# Patient Record
Sex: Male | Born: 1995 | Race: Black or African American | Hispanic: No | Marital: Married | Smoking: Never smoker
Health system: Southern US, Community
[De-identification: ages and names within clinical notes are randomized; demographics above are authoritative.]

---
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
## 2014-07-19 IMAGING — CR DG FOREARM 2V*L*
1 series · 1 of 1 positions shown · non-contrast
Comparison: None.

CLINICAL DATA: EXTREMITY LACERATION EXTREMITY LACERATION

EXAM:
LEFT FOREARM - 2 VIEW

[view not recorded]
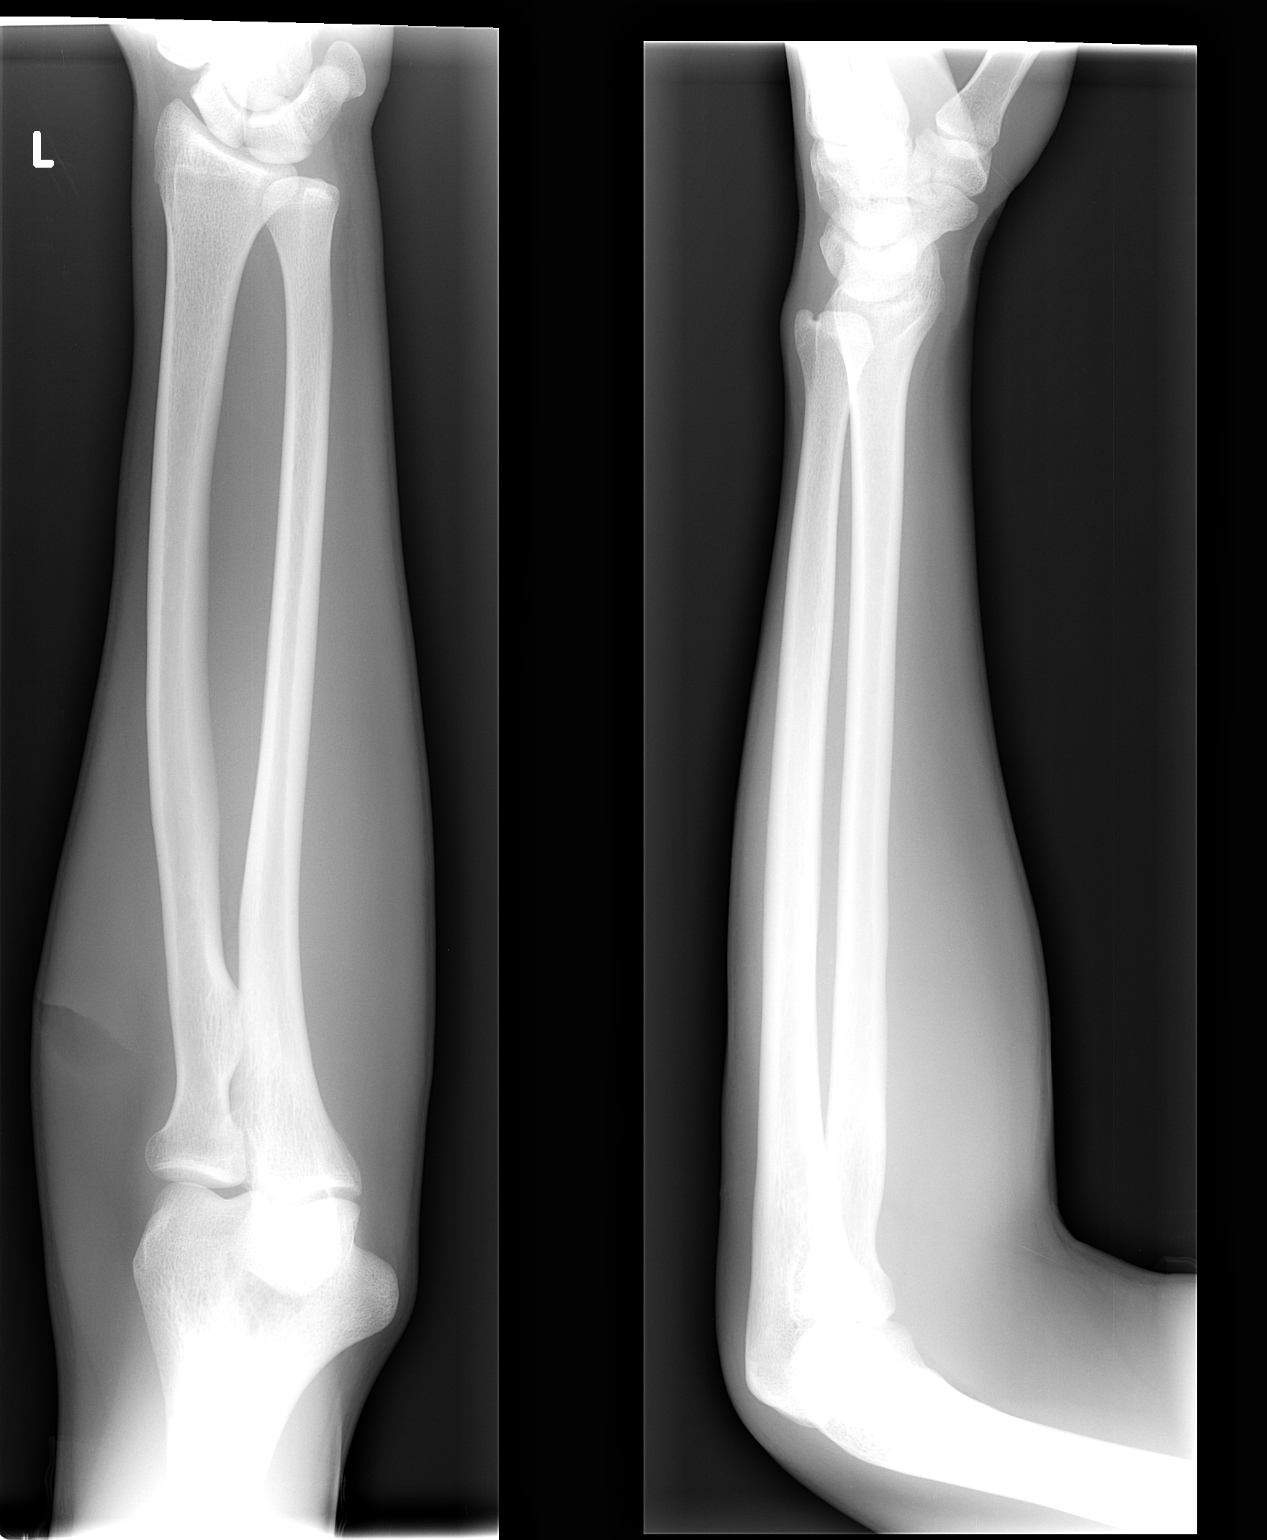

[1 of 1 positions shown; findings below may reference images not displayed]

FINDINGS: Large soft tissue defect at the proximal forearm is compatible with
history of laceration. No retained foreign body. No acute fracture
or dislocation. Limited views of the wrist and elbow are
unremarkable.
IMPRESSION: Soft tissue laceration at the proximal left forearm with no retained
foreign body identified.

No acute fracture or dislocation.

## 2018-11-04 ENCOUNTER — Other Ambulatory Visit: Payer: Self-pay | Admitting: *Deleted

## 2018-11-04 DIAGNOSIS — Z20822 Contact with and (suspected) exposure to covid-19: Secondary | ICD-10-CM

## 2018-11-05 LAB — NOVEL CORONAVIRUS, NAA: SARS-CoV-2, NAA: NOT DETECTED

## 2019-08-15 ENCOUNTER — Ambulatory Visit: Payer: Self-pay

## 2021-05-24 ENCOUNTER — Other Ambulatory Visit: Payer: Self-pay

## 2021-05-24 ENCOUNTER — Encounter (HOSPITAL_COMMUNITY): Payer: Self-pay | Admitting: Emergency Medicine

## 2021-05-24 ENCOUNTER — Emergency Department (HOSPITAL_COMMUNITY)
Admission: EM | Admit: 2021-05-24 | Discharge: 2021-05-24 | Disposition: A | Discharge status: Home / Self Care | Payer: Self-pay | Attending: Emergency Medicine | Admitting: Emergency Medicine

## 2021-05-24 DIAGNOSIS — H1032 Unspecified acute conjunctivitis, left eye: Secondary | ICD-10-CM | POA: Insufficient documentation

## 2021-05-24 MED ORDER — ERYTHROMYCIN 5 MG/GM OP OINT: TOPICAL_OINTMENT | OPHTHALMIC | 0 refills | Status: DC

## 2021-05-24 NOTE — ED Provider Notes (Signed)
?[name] ?Provider Note ? ? ?CSN: [id number] ?Arrival date & time: 05/24/21  1052 ? ?  ? ?History ? ?Chief Complaint  ?Patient presents with  ? Conjunctivitis  ? ? ?Travis Hurst is a 26 y.o. male. ? ?Patient with redness and discharge from the left eye started yesterday.  Was exposed to some family members with pinkeye.  He thinks it is that.  No eyeball pain does not feel like anything is in the eye.  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  And is used to having foreign body in the eye.  None of it feels like that.  This is just itchy.  Does have allergies.  But no problem with the right eye.  Patient without any other symptoms.  No upper respiratory symptoms. ? ? ?  ? ?Home Medications ?Prior to Admission medications   ?Medication Sig Start Date End Date Taking? Authorizing Provider  ?erythromycin ophthalmic ointment Place a 1/2 inch ribbon of ointment into the lower eyelid of the left eye 3 times a day. 05/24/21  Yes Vanetta Mulders, MD  ?cyclobenzaprine (FLEXERIL) 10 MG tablet Take 10 mg by mouth 3 (three) times daily as needed for muscle spasms.    [provider]  ?   ? ?Allergies    ?Patient has no known allergies.   ? ?Review of Systems   ?Review of Systems  ?Constitutional:  Negative for chills and fever.  ?HENT:  Negative for ear pain and sore throat.   ?Eyes:  Positive for discharge, redness and itching. Negative for photophobia, pain and visual disturbance.  ?Respiratory:  Negative for cough and shortness of breath.   ?Cardiovascular:  Negative for chest pain and palpitations.  ?Gastrointestinal:  Negative for abdominal pain and vomiting.  ?Genitourinary:  Negative for dysuria and hematuria.  ?Musculoskeletal:  Negative for arthralgias and back pain.  ?Skin:  Negative for color change and rash.  ?Neurological:  Negative for seizures and syncope.  ?All other systems reviewed and are negative. ? ?Physical  Exam ?Updated Vital Signs ?BP (!) 147/93 (BP Location: Right Arm)   Pulse 72   Temp 98.4 ?F (36.9 ?C) (Oral)   Resp 18   Ht 1.88 m (6\' 2" )   Wt 90.7 kg   SpO2 100%   BMI 25.68 kg/m?  ?Physical Exam ?Vitals and nursing note reviewed.  ?Constitutional:   ?   General: He is not in acute distress. ?   Appearance: Normal appearance. He is well-developed. He is not ill-appearing.  ?HENT:  ?   Head: Normocephalic and atraumatic.  ?Eyes:  ?   General:     ?   Left eye: Discharge present. ?   Extraocular Movements: Extraocular movements intact.  ?   Pupils: Pupils are equal, round, and reactive to light.  ?   Comments: Left eye with purulent discharge.  No hyphema.  No evidence of foreign body.  Cornea clear.  No corneal uptake.  ?Cardiovascular:  ?   Rate and Rhythm: Normal rate and regular rhythm.  ?   Heart sounds: No murmur heard. ?Pulmonary:  ?   Effort: Pulmonary effort is normal. No respiratory distress.  ?   Breath sounds: Normal breath sounds.  ?Abdominal:  ?   Palpations: Abdomen is soft.  ?   Tenderness: There is no abdominal tenderness.  ?Musculoskeletal:     ?   General: No swelling.  ?  Cervical back: Neck supple.  ?Skin: ?   General: Skin is warm and dry.  ?   Capillary Refill: Capillary refill takes less than 2 seconds.  ?Neurological:  ?   General: No focal deficit present.  ?   Mental Status: He is alert and oriented to person, place, and time.  ?Psychiatric:     ?   Mood and Affect: Mood normal.  ? ? ?ED Results / Procedures / Treatments   ?Labs ?(all labs ordered are listed, but only abnormal results are displayed) ?Labs Reviewed - No data to display ? ?EKG ?None ? ?Radiology ?No results found. ? ?Procedures ?Procedures  ? ? ?Medications Ordered in ED ?Medications - No data to display ? ?ED Course/ Medical Decision Making/ A&P ?  ?                        ?Medical Decision Making ?Risk ?Prescription drug management. ? ? ?Patient probably with a viral conjunctivitis.  Does not seem to have foreign  body does not seem to be a UV keratitis from his job as a Psychologist, occupational.  Plus patient does not think it reminds him of that at all.  He is experience both of those problems due to his job. ? ?Will treat with erythromycin ophthalmic ointment.  Work note provided as needed ?Final Clinical Impression(s) / ED Diagnoses ?Final diagnoses:  ?Acute conjunctivitis of left eye, unspecified acute conjunctivitis type  ? ? ?Rx / DC Orders ?ED Discharge Orders   ? ?      Ordered  ?  erythromycin ophthalmic ointment       ? 05/24/21 1158  ? ?  ?  ? ?  ? ? ?  ?Vanetta Mulders, MD ?05/24/21 1203 ? ?

## 2021-05-24 NOTE — ED Triage Notes (Signed)
Pt having redness and discharge from left eye, family members diagnosis with pink eye. ?

## 2021-05-24 NOTE — Discharge Instructions (Signed)
Return for any new or worse symptoms.  Apply the antibiotic ointment into the lower lid of the left eye 3 times a day.  Use warm compresses to clean out any crusting or any of the purulent discharge. ?

## 2022-03-29 ENCOUNTER — Emergency Department (HOSPITAL_COMMUNITY)
Admission: EM | Admit: 2022-03-29 | Discharge: 2022-03-29 | Disposition: A | Discharge status: Home / Self Care | Payer: Self-pay | Attending: Emergency Medicine | Admitting: Emergency Medicine

## 2022-03-29 ENCOUNTER — Other Ambulatory Visit: Payer: Self-pay

## 2022-03-29 DIAGNOSIS — X58XXXA Exposure to other specified factors, initial encounter: Secondary | ICD-10-CM | POA: Insufficient documentation

## 2022-03-29 DIAGNOSIS — T401X1A Poisoning by heroin, accidental (unintentional), initial encounter: Secondary | ICD-10-CM | POA: Insufficient documentation

## 2022-03-29 LAB — RAPID URINE DRUG SCREEN, HOSP PERFORMED
Amphetamines: NOT DETECTED
Barbiturates: NOT DETECTED
Benzodiazepines: NOT DETECTED
Cocaine: NOT DETECTED
Opiates: POSITIVE — AB
Tetrahydrocannabinol: POSITIVE — AB

## 2022-03-29 NOTE — Discharge Instructions (Signed)
You were seen in the emergency department following an accidental overdose. Thankfully you remained stable after Narcan was administered to you by EMS. Given that you remained stable during the observation period, I believe you are safe to discharge home but please refrain from using any recreational substance as there is always risk of injury and death from these.

## 2022-03-29 NOTE — ED Provider Notes (Signed)
[facility] Provider Note   CSN: [id number] Arrival date & time: 03/29/22  1055     History Chief Complaint  Patient presents with   Drug Overdose    Travis Hurst is a 27 y.o. male.  Patient presents to the emergency department with complaints of drug overdose.  He reports that he was at work earlier today when he used heroin around 9:45 AM.  Soon after he became unconscious and had EMS called in.  Patient had 2 mg of Narcan given which helped return patient somewhat to baseline.  Patient was observed to be stable and ambulating in triage without any acute distress.  Patient is now reporting that he is fatigued and tired.  Denies prior use of heroin or other drugs.  Not currently using any substances including alcohol, tobacco, marijuana, IV drug use.  Currently denying any chest pain, shortness of breath, abdominal pain, nausea, vomiting, diarrhea.   Drug Overdose      Home Medications Prior to Admission medications   Medication Sig Start Date End Date Taking? Authorizing Provider  cyclobenzaprine (FLEXERIL) 10 MG tablet Take 10 mg by mouth 3 (three) times daily as needed for muscle spasms. Patient not taking: Reported on 03/29/2022    [provider]  erythromycin ophthalmic ointment Place a 1/2 inch ribbon of ointment into the lower eyelid of the left eye 3 times a day. Patient not taking: Reported on 03/29/2022 05/24/21   Fredia Sorrow, MD      Allergies    Patient has no known allergies.    Review of Systems   Review of Systems  Neurological:  Positive for syncope.  All other systems reviewed and are negative.   Physical Exam Updated Vital Signs BP (!) 159/84   Pulse 91   Temp 98.7 F (37.1 C) (Oral)   Resp 15   Ht 6\' 2"  (1.88 m)   Wt 83.9 kg   SpO2 100%   BMI 23.75 kg/m  Physical Exam Vitals and nursing note reviewed.  Constitutional:      General: He is not in acute distress.    Appearance: Normal  appearance. He is not ill-appearing.  HENT:     Head: Normocephalic and atraumatic.     Mouth/Throat:     Mouth: Mucous membranes are dry.  Eyes:     General: No scleral icterus.       Right eye: No discharge.        Left eye: No discharge.     Extraocular Movements: Extraocular movements intact.     Conjunctiva/sclera: Conjunctivae normal.     Pupils: Pupils are equal, round, and reactive to light.  Cardiovascular:     Rate and Rhythm: Normal rate and regular rhythm.     Pulses: Normal pulses.     Heart sounds: Normal heart sounds. No murmur heard. Pulmonary:     Effort: Pulmonary effort is normal. No respiratory distress.     Breath sounds: Normal breath sounds.  Abdominal:     General: Abdomen is flat. There is no distension.     Tenderness: There is no abdominal tenderness.  Musculoskeletal:     Cervical back: Normal range of motion and neck supple. No rigidity.  Skin:    General: Skin is warm and dry.     Capillary Refill: Capillary refill takes less than 2 seconds.     Findings: No erythema.  Neurological:     General: No focal deficit present.  Mental Status: He is alert and oriented to person, place, and time. Mental status is at baseline.     Cranial Nerves: No cranial nerve deficit.     Motor: No weakness.  Psychiatric:        Mood and Affect: Mood normal.    ED Results / Procedures / Treatments   Labs (all labs ordered are listed, but only abnormal results are displayed) Labs Reviewed  RAPID URINE DRUG SCREEN, HOSP PERFORMED - Abnormal; Notable for the following components:      Result Value   Opiates POSITIVE (*)    Tetrahydrocannabinol POSITIVE (*)    All other components within normal limits    EKG EKG Interpretation  Date/Time:  Thursday March 29 2022 11:02:20 EST Ventricular Rate:  80 PR Interval:  160 QRS Duration: 81 QT Interval:  388 QTC Calculation: 448 R Axis:   62 Text Interpretation: Sinus rhythm Probable left atrial enlargement RSR'  in V1 or V2, probably normal variant ST elevation suggests acute pericarditis No old tracing to compare Confirmed by Malvin Johns [phone]) on 03/30/2022 9:19:30 AM  Radiology No results found.  Procedures Procedures   Medications Ordered in ED Medications - No data to display  ED Course/ Medical Decision Making/ A&P                           Medical Decision Making Amount and/or Complexity of Data Reviewed Labs: ordered.   This patient presents to the ED for concern of drug overdose. Differential diagnosis includes heroin use, accidental overdose, intentional overdose   Lab Tests:  I Ordered, and personally interpreted labs.  The pertinent results include:  UDS positive for opiates and tetrahydrocannabinol   Problem List / ED Course:  Patient presented to the ED following a drug overdose. After talking with patient, it appears that this was an accidental overdose as patient used heroin provided by a friend. Patient denied previously using heroin and reported only marijuana and alcohol use previously. Patient denied any thoughts of self-harm or suicide. Patient expressed concerns that the drug may have been "laced with fentanyl". Patient was stable during the entire encounter with no acute concerning changes noted in vitals or cognitive state but appeared somewhat remorseful about this event. Given patient arrived to the conscious, alert, stable, and in no acute distress, a two hour period of observation was utilized during which time patient remained stable and I did not feel it was necessary to perform any additional lab workup or interventions during this time. Provided patient with substance use resources in the community. Patient agreeable with plan to discharge home and verbalized understanding return precautions. Also advised patient against any substance use. All questions answered prior to patient discharge.   Final Clinical Impression(s) / ED Diagnoses Final diagnoses:   Accidental overdose of heroin, initial encounter [facility])    Rx / DC Orders ED Discharge Orders     None         Vladimir Creeks 04/02/22 0825    Fransico Meadow, MD 04/02/22 1725

## 2022-03-29 NOTE — ED Triage Notes (Signed)
Pt BIB [facility] from work for possible overdose. 2 mg nasal narcan given by EMS, pt admits to doing heroin around 0945. Pt is alert and oriented in triage.

## 2022-09-04 ENCOUNTER — Ambulatory Visit
Admission: EM | Admit: 2022-09-04 | Discharge: 2022-09-04 | Disposition: A | Discharge status: Home / Self Care | Payer: Self-pay | Attending: Nurse Practitioner | Admitting: Nurse Practitioner

## 2022-09-04 ENCOUNTER — Ambulatory Visit (INDEPENDENT_AMBULATORY_CARE_PROVIDER_SITE_OTHER): Payer: Self-pay

## 2022-09-04 DIAGNOSIS — S93402A Sprain of unspecified ligament of left ankle, initial encounter: Secondary | ICD-10-CM

## 2022-09-04 DIAGNOSIS — M25572 Pain in left ankle and joints of left foot: Secondary | ICD-10-CM

## 2022-09-04 DIAGNOSIS — M79672 Pain in left foot: Secondary | ICD-10-CM

## 2022-09-04 DIAGNOSIS — S93602A Unspecified sprain of left foot, initial encounter: Secondary | ICD-10-CM

## 2022-09-04 NOTE — Discharge Instructions (Addendum)
The x-rays of your left foot and ankle are negative for fracture.  It appears you have a moderate strain/sprain to the left ankle and left foot. A cam boot has been provided to provide compression and support.  Wear this boot when you are engaged in prolonged or strenuous activity.   Continue over-the-counter Tylenol or ibuprofen as needed for pain or discomfort. RICE therapy, rest, ice, compression, and elevation.  Apply ice for 20 minutes, remove for 1 hour, repeat is much as possible. If the swelling has not improved over the next 48 to 72 hours, it is recommended that you follow-up with orthopedics for further evaluation.  You can follow-up with [facility] or with [facility]. Follow-up as needed.

## 2022-09-04 NOTE — ED Triage Notes (Signed)
Pt c/o left foot pain and swelling due to injury that occurred last week. Pt states he had fallen last week, and hit his face and injured his foot. There are visible abrasions to the foot and significant swelling, pt states the pain shoots up his leg when he bends over. Swelling has not gone down since injury.

## 2022-09-04 NOTE — ED Provider Notes (Signed)
[facility]    CSN: [id number] Arrival date & time: 09/04/22  0957      History   Chief Complaint No chief complaint on file.   HPI Travis Hurst is a 27 y.o. male.   The history is provided by the patient.   The patient presents for complaints of left foot and ankle pain.  Patient states 1 week ago, he fell and injured his foot.  He states that he did go back to work this week, and that is when the swelling and pain worsened.  He states the pain moves up his leg when he bends over.  He states that he has had the same amount of swelling since the injury occurred.  He denies numbness, tingling, inability to bear weight, or previous injury to the left foot and ankle.  He reports he has been using heat, ibuprofen, and ice.  History reviewed. No pertinent past medical history.  There are no problems to display for this patient.   History reviewed. No pertinent surgical history.     Home Medications    Prior to Admission medications   Medication Sig Start Date End Date Taking? Authorizing Provider  cyclobenzaprine (FLEXERIL) 10 MG tablet Take 10 mg by mouth 3 (three) times daily as needed for muscle spasms. Patient not taking: Reported on 03/29/2022    [provider]  erythromycin ophthalmic ointment Place a 1/2 inch ribbon of ointment into the lower eyelid of the left eye 3 times a day. Patient not taking: Reported on 03/29/2022 05/24/21   Vanetta Mulders, MD    Family History History reviewed. No pertinent family history.  Social History Social History   Tobacco Use   Smoking status: Never  Substance Use Topics   Alcohol use: Yes   Drug use: No     Allergies   Patient has no known allergies.   Review of Systems Review of Systems Per HPI  Physical Exam Triage Vital Signs ED Triage Vitals  Encounter Vitals Group     BP 09/04/22 1049 (!) 157/90     Systolic BP Percentile --      Diastolic BP Percentile --      Pulse Rate 09/04/22 1049  76     Resp 09/04/22 1049 12     Temp 09/04/22 1049 98.3 F (36.8 C)     Temp Source 09/04/22 1049 Oral     SpO2 09/04/22 1049 98 %     Weight --      Height --      Head Circumference --      Peak Flow --      Pain Score 09/04/22 1054 6     Pain Loc --      Pain Education --      Exclude from Growth Chart --    No data found.  Updated Vital Signs BP (!) 157/90 (BP Location: Right Arm)   Pulse 76   Temp 98.3 F (36.8 C) (Oral)   Resp 12   SpO2 98%   Visual Acuity Right Eye Distance:   Left Eye Distance:   Bilateral Distance:    Right Eye Near:   Left Eye Near:    Bilateral Near:     Physical Exam Vitals and nursing note reviewed.  Constitutional:      General: He is not in acute distress.    Appearance: Normal appearance.  HENT:     Head: Normocephalic.  Eyes:     Extraocular Movements: Extraocular movements  intact.     Pupils: Pupils are equal, round, and reactive to light.  Pulmonary:     Effort: Pulmonary effort is normal.  Musculoskeletal:     Cervical back: Normal range of motion.     Left ankle: Swelling (moderate) present. Tenderness present over the ATF ligament and AITF ligament. Decreased range of motion.     Left foot: Decreased range of motion. Normal capillary refill. Swelling (Moderate swelling noted to the dorsal aspect of the left foot.) and tenderness (Dorsal aspect of left foot) present. Normal pulse.  Skin:    General: Skin is warm and dry.  Neurological:     General: No focal deficit present.     Mental Status: He is alert and oriented to person, place, and time.  Psychiatric:        Mood and Affect: Mood normal.        Behavior: Behavior normal.      UC Treatments / Results  Labs (all labs ordered are listed, but only abnormal results are displayed) Labs Reviewed - No data to display  EKG   Radiology DG Ankle Complete Left  Result Date: 09/04/2022 CLINICAL DATA:  Swelling after fall on Thursday. Medial foot and ankle pain.  EXAM: LEFT ANKLE COMPLETE - 3+ VIEW COMPARISON:  None Available. FINDINGS: Mild diffuse soft tissue edema. No underlying fracture or dislocation identified. No radio-opaque foreign bodies or soft tissue calcifications. IMPRESSION: Soft tissue edema. No fracture or dislocation. Electronically Signed   By: Signa Kell M.D.   On: 09/04/2022 12:09   DG Foot Complete Left  Result Date: 09/04/2022 CLINICAL DATA:  Swelling after fall.  Medial foot and ankle pain. EXAM: LEFT FOOT - COMPLETE 3+ VIEW COMPARISON:  None Available. FINDINGS: There is no evidence of fracture or dislocation. There is no evidence of arthropathy or other focal bone abnormality. Soft tissues are unremarkable. IMPRESSION: Negative. Electronically Signed   By: Signa Kell M.D.   On: 09/04/2022 12:08    Procedures Procedures (including critical care time)  Medications Ordered in UC Medications - No data to display  Initial Impression / Assessment and Plan / UC Course  I have reviewed the triage vital signs and the nursing notes.  Pertinent labs & imaging results that were available during my care of the patient were reviewed by me and considered in my medical decision making (see chart for details).  The patient is well-appearing, he is in no acute distress, vital signs are stable.  X-rays of the left foot and ankle are negative for fracture or dislocation.  There is soft tissue swelling seen on the exam of the ankle.  Symptoms are consistent with a sprain/strain of the left foot and ankle.  Cam boot provided for compression and support of the left foot and ankle due to the severity of the swelling.  Supportive care recommendations were provided and discussed with the patient to include over-the-counter analgesics, RICE therapy, and weightbearing as tolerated.  Patient was advised to follow-up with orthopedics within the next 3 to 5 days for reevaluation, or sooner if symptoms worsen.  Patient was given information for [facility] and for [facility].  Patient is in agreement with this plan of care and verbalizes understanding.  All questions were answered.  Patient stable for discharge.  Work note was provided.   Final Clinical Impressions(s) / UC Diagnoses   Final diagnoses:  Moderate left ankle sprain, initial encounter  Foot sprain, left, initial encounter     Discharge  Instructions      The x-rays of your left foot and ankle are negative for fracture.  It appears you have a moderate strain/sprain to the left ankle and left foot. A cam boot has been provided to provide compression and support.  Wear this boot when you are engaged in prolonged or strenuous activity.   Continue over-the-counter Tylenol or ibuprofen as needed for pain or discomfort. RICE therapy, rest, ice, compression, and elevation.  Apply ice for 20 minutes, remove for 1 hour, repeat is much as possible. If the swelling has not improved over the next 48 to 72 hours, it is recommended that you follow-up with orthopedics for further evaluation.  You can follow-up with [facility] or with [facility]. Follow-up as needed.     ED Prescriptions   None    PDMP not reviewed this encounter.   Abran Cantor, NP 09/04/22 1221

## 2022-09-04 NOTE — ED Notes (Signed)
Pt has been been on instructed on how to properly place Cam-Boot on the left foot when engaged in strenuous activity. Taking Tylenol or Ibuprofen as needed for pain, use R.I.C.E method for swelling, applying ice for 20 minutes at the time, resting for 1 hours periods in between. Informed that Crocs are not an Ideal shoe while wearing the Cam-Boot. To follow up with either [facility] or [facility]  in 48-72 hours if swelling has not gotten any better.   Pt and pt's significant other has both verbalized understanding.

## 2023-01-14 ENCOUNTER — Other Ambulatory Visit: Payer: Self-pay

## 2023-01-14 ENCOUNTER — Inpatient Hospital Stay (HOSPITAL_COMMUNITY)
Admission: EM | Admit: 2023-01-14 | Discharge: 2023-01-16 | DRG: 092 | Disposition: A | Discharge status: Home / Self Care | Payer: Self-pay | Attending: Family Medicine | Admitting: Family Medicine

## 2023-01-14 ENCOUNTER — Encounter (HOSPITAL_COMMUNITY): Payer: Self-pay | Admitting: *Deleted

## 2023-01-14 DIAGNOSIS — F15129 Other stimulant abuse with intoxication, unspecified: Secondary | ICD-10-CM | POA: Diagnosis present

## 2023-01-14 DIAGNOSIS — F15159 Other stimulant abuse with stimulant-induced psychotic disorder, unspecified: Secondary | ICD-10-CM | POA: Diagnosis present

## 2023-01-14 DIAGNOSIS — Y904 Blood alcohol level of 80-99 mg/100 ml: Secondary | ICD-10-CM | POA: Diagnosis present

## 2023-01-14 DIAGNOSIS — F19959 Other psychoactive substance use, unspecified with psychoactive substance-induced psychotic disorder, unspecified: Secondary | ICD-10-CM | POA: Insufficient documentation

## 2023-01-14 DIAGNOSIS — G928 Other toxic encephalopathy: Principal | ICD-10-CM | POA: Diagnosis present

## 2023-01-14 DIAGNOSIS — Z87891 Personal history of nicotine dependence: Secondary | ICD-10-CM

## 2023-01-14 DIAGNOSIS — G9341 Metabolic encephalopathy: Secondary | ICD-10-CM | POA: Diagnosis present

## 2023-01-14 DIAGNOSIS — Z7141 Alcohol abuse counseling and surveillance of alcoholic: Secondary | ICD-10-CM

## 2023-01-14 DIAGNOSIS — M6282 Rhabdomyolysis: Secondary | ICD-10-CM | POA: Diagnosis present

## 2023-01-14 DIAGNOSIS — F10929 Alcohol use, unspecified with intoxication, unspecified: Secondary | ICD-10-CM | POA: Diagnosis present

## 2023-01-14 DIAGNOSIS — G9340 Encephalopathy, unspecified: Principal | ICD-10-CM

## 2023-01-14 DIAGNOSIS — F1290 Cannabis use, unspecified, uncomplicated: Secondary | ICD-10-CM | POA: Diagnosis present

## 2023-01-14 DIAGNOSIS — F19921 Other psychoactive substance use, unspecified with intoxication with delirium: Secondary | ICD-10-CM

## 2023-01-14 DIAGNOSIS — Z7151 Drug abuse counseling and surveillance of drug abuser: Secondary | ICD-10-CM

## 2023-01-14 DIAGNOSIS — Z79899 Other long term (current) drug therapy: Secondary | ICD-10-CM

## 2023-01-14 DIAGNOSIS — N179 Acute kidney failure, unspecified: Secondary | ICD-10-CM | POA: Diagnosis present

## 2023-01-14 DIAGNOSIS — F10129 Alcohol abuse with intoxication, unspecified: Secondary | ICD-10-CM | POA: Diagnosis present

## 2023-01-14 DIAGNOSIS — F1910 Other psychoactive substance abuse, uncomplicated: Secondary | ICD-10-CM

## 2023-01-14 LAB — COMPREHENSIVE METABOLIC PANEL
ALT: 41 U/L (ref 0–44)
AST: 48 U/L — ABNORMAL HIGH (ref 15–41)
Albumin: 4.3 g/dL (ref 3.5–5.0)
Alkaline Phosphatase: 54 U/L (ref 38–126)
Anion gap: 11 (ref 5–15)
BUN: 13 mg/dL (ref 6–20)
CO2: 24 mmol/L (ref 22–32)
Calcium: 9 mg/dL (ref 8.9–10.3)
Chloride: 105 mmol/L (ref 98–111)
Creatinine, Ser: 1.45 mg/dL — ABNORMAL HIGH (ref 0.61–1.24)
GFR, Estimated: >60 mL/min (ref >60)
Glucose, Bld: 142 mg/dL — ABNORMAL HIGH (ref 70–99)
Potassium: 3.6 mmol/L (ref 3.5–5.1)
Sodium: 140 mmol/L (ref 135–145)
Total Bilirubin: 1.2 mg/dL — ABNORMAL HIGH (ref <1.2)
Total Protein: 7.4 g/dL (ref 6.5–8.1)

## 2023-01-14 LAB — URINALYSIS, W/ REFLEX TO CULTURE (INFECTION SUSPECTED)
Bacteria, UA: NONE SEEN
Bilirubin Urine: NEGATIVE
Glucose, UA: NEGATIVE mg/dL
Hgb urine dipstick: NEGATIVE
Ketones, ur: NEGATIVE mg/dL
Leukocytes,Ua: NEGATIVE
Nitrite: NEGATIVE
Protein, ur: NEGATIVE mg/dL
Specific Gravity, Urine: 1.006 (ref 1.005–1.030)
pH: 6 (ref 5.0–8.0)

## 2023-01-14 LAB — CBC WITH DIFFERENTIAL/PLATELET
Abs Immature Granulocytes: 0.02 10*3/uL (ref 0.00–0.07)
Basophils Absolute: 0 10*3/uL (ref 0.0–0.1)
Basophils Relative: 1 %
Eosinophils Absolute: 0.1 10*3/uL (ref 0.0–0.5)
Eosinophils Relative: 1 %
HCT: 41.7 % (ref 39.0–52.0)
Hemoglobin: 13.6 g/dL (ref 13.0–17.0)
Immature Granulocytes: 0 %
Lymphocytes Relative: 45 %
Lymphs Abs: 3.1 10*3/uL (ref 0.7–4.0)
MCH: 29.6 pg (ref 26.0–34.0)
MCHC: 32.6 g/dL (ref 30.0–36.0)
MCV: 90.8 fL (ref 80.0–100.0)
Monocytes Absolute: 1 10*3/uL (ref 0.1–1.0)
Monocytes Relative: 15 %
Neutro Abs: 2.5 10*3/uL (ref 1.7–7.7)
Neutrophils Relative %: 38 %
Platelets: 269 10*3/uL (ref 150–400)
RBC: 4.59 MIL/uL (ref 4.22–5.81)
RDW: 13.3 % (ref 11.5–15.5)
WBC: 6.6 10*3/uL (ref 4.0–10.5)
nRBC: 0 % (ref 0.0–0.2)

## 2023-01-14 LAB — SALICYLATE LEVEL: Salicylate Lvl: <7 mg/dL — ABNORMAL LOW (ref 7.0–30.0)

## 2023-01-14 LAB — BLOOD GAS, VENOUS
Acid-Base Excess: 0.9 mmol/L (ref 0.0–2.0)
Bicarbonate: 27.5 mmol/L (ref 20.0–28.0)
Drawn by: 68414
O2 Saturation: 72.1 %
Patient temperature: 37.3
pCO2, Ven: 52 mm[Hg] (ref 44–60)
pH, Ven: 7.34 (ref 7.25–7.43)
pO2, Ven: 41 mm[Hg] (ref 32–45)

## 2023-01-14 LAB — I-STAT CHEM 8, ED
BUN: 12 mg/dL (ref 6–20)
Calcium, Ion: 1.16 mmol/L (ref 1.15–1.40)
Chloride: 103 mmol/L (ref 98–111)
Creatinine, Ser: 1.7 mg/dL — ABNORMAL HIGH (ref 0.61–1.24)
Glucose, Bld: 139 mg/dL — ABNORMAL HIGH (ref 70–99)
HCT: 43 % (ref 39.0–52.0)
Hemoglobin: 14.6 g/dL (ref 13.0–17.0)
Potassium: 3.6 mmol/L (ref 3.5–5.1)
Sodium: 142 mmol/L (ref 135–145)
TCO2: 25 mmol/L (ref 22–32)

## 2023-01-14 LAB — CBG MONITORING, ED: Glucose-Capillary: 200 mg/dL — ABNORMAL HIGH (ref 70–99)

## 2023-01-14 LAB — RAPID URINE DRUG SCREEN, HOSP PERFORMED
Amphetamines: NOT DETECTED
Barbiturates: NOT DETECTED
Benzodiazepines: NOT DETECTED
Cocaine: NOT DETECTED
Opiates: NOT DETECTED
Tetrahydrocannabinol: POSITIVE — AB

## 2023-01-14 LAB — ETHANOL: Alcohol, Ethyl (B): 96 mg/dL — ABNORMAL HIGH (ref <10)

## 2023-01-14 LAB — ACETAMINOPHEN LEVEL: Acetaminophen (Tylenol), Serum: <10 ug/mL — ABNORMAL LOW (ref 10–30)

## 2023-01-14 LAB — MAGNESIUM: Magnesium: 2.2 mg/dL (ref 1.7–2.4)

## 2023-01-14 LAB — CK: Total CK: 2637 U/L — ABNORMAL HIGH (ref 49–397)

## 2023-01-14 LAB — PHOSPHORUS: Phosphorus: 4.5 mg/dL (ref 2.5–4.6)

## 2023-01-14 MED ORDER — SODIUM CHLORIDE 0.9 % IV BOLUS
1000.0000 mL | Freq: Once | INTRAVENOUS | Status: AC
  Administered 2023-01-14: 1000 mL via INTRAVENOUS

## 2023-01-14 MED ORDER — LORAZEPAM 2 MG/ML IJ SOLN
2.0000 mg | Freq: Once | INTRAMUSCULAR | Status: AC
  Administered 2023-01-14: 2 mg via INTRAVENOUS
  Filled 2023-01-14: qty 1

## 2023-01-14 MED ORDER — LORAZEPAM 2 MG/ML IJ SOLN
1.0000 mg | Freq: Once | INTRAMUSCULAR | Status: AC
  Administered 2023-01-14: 1 mg via INTRAVENOUS
  Filled 2023-01-14: qty 1

## 2023-01-14 NOTE — H&P (Signed)
History and Physical    Travis Hurst XBJ:[id number] DOB: Sep 30, 1995 DOA: 01/14/2023  PCP: Patient, No Pcp Per   Patient coming from: Home  I have personally briefly reviewed patient's old medical records in [facility]  Chief Complaint: AMS  HPI: Travis Hurst is a 27 y.o. male with medical history significant for substance abuse.  Patient was brought to the ED by EMS from our hospital parking lot reports of altered mental status. At the time of my evaluation, patient is awake alert oriented calm and able to answer questions.  He remembers the events of the day.  His wife was admitted to the hospital and was discharged today.  He was coming back to the hospital from running errands getting food for his spouse,.  Also gotten some "weed" in a vape pen from someone who we had not gotten from before.  He now does not think it was weed inside, but he does not know what was in what he smoked.  Hospital security called [employer] due to patient's abnormal behavior in the parking lot, patient was agitated, diaphoretic, acting nonsensically, EMS report heart rate in the 190s.  Later patient's wife came down to the ED after she was discharged from the hospital, she pulled the EDP aside in the hallway and told the EDP that patient told her "he tried to cough himself" that he has never done this before and she does not know why he would try to do this, and patient told her he is purple pills.  spouse also reported patient drinks 12 packs of beer at the time.  I asked patient about his, he denies ingesting or inhaling any purple pills, denies any suicidal ideation now or ever.  Tells me he drank a few shots of brandy prior to coming to the ED (when he passed by family's house and they were having a celebration on his way back to the hospital). He denies daily alcohol abuse, he is not on any medications and he has no medical problems.  He tells me he does not want [employer] -child protective services to know he is  admitted in the hospital.  The child in question is his child and also the child of his current wife.  ED Course: Tachycardic to 171.  Respirate rate 22-26.  Blood pressure systolic 130s to 308M.  Temperature 98.4.  O2 sats currently 90s on room air.  Alcohol level 96.  CK 2637.  UDS positive for marijuana.   VBG pH of 7.34. Ativan 1 mg and then 2 mg given, 1 L bolus given.  With improvement in mental status. Due to concern for suicidal ideation, patient with IVCD.  Review of Systems: As per HPI all other systems reviewed and negative.  No past medical history on file.  No past surgical history on file.   reports that he has never smoked. He does not have any smokeless tobacco history on file. He reports current alcohol use. He reports current drug use. Drug: Marijuana.  No Known Allergies  Family history of hypertension.  Prior to Admission medications   Medication Sig Start Date End Date Taking? Authorizing Provider  cyclobenzaprine (FLEXERIL) 10 MG tablet Take 10 mg by mouth 3 (three) times daily as needed for muscle spasms. Patient not taking: Reported on 03/29/2022    [provider]  erythromycin ophthalmic ointment Place a 1/2 inch ribbon of ointment into the lower eyelid of the left eye 3 times a day. Patient not taking: Reported on 03/29/2022  05/24/21   Vanetta Mulders, MD    Physical Exam: Vitals:   01/14/23 1550 01/14/23 1600 01/14/23 1605 01/14/23 1737  BP: (!) 136/94 (!) 139/106  (!) 160/82  Pulse: (!) 153 (!) 147  (!) 109  Resp:      Temp:   98.4 F (36.9 C)   TempSrc:   Axillary   SpO2:  93%    Weight:      Height:        Constitutional: NAD, calm, comfortable Vitals:   01/14/23 1550 01/14/23 1600 01/14/23 1605 01/14/23 1737  BP: (!) 136/94 (!) 139/106  (!) 160/82  Pulse: (!) 153 (!) 147  (!) 109  Resp:      Temp:   98.4 F (36.9 C)   TempSrc:   Axillary   SpO2:  93%    Weight:      Height:       Eyes: PERRL, lids and conjunctivae  normal ENMT: Mucous membranes are moist.  Neck: normal, supple, no masses, no thyromegaly Respiratory: clear to auscultation bilaterally, no wheezing, no crackles. Normal respiratory effort. No accessory muscle use.  Cardiovascular: Regular rate and rhythm, no murmurs / rubs / gallops. No extremity edema.  Extremities warm.   Abdomen: no tenderness, no masses palpated. No hepatosplenomegaly. Bowel sounds positive.  Musculoskeletal: no clubbing / cyanosis. No joint deformity upper and lower extremities.  Skin: no rashes, lesions, ulcers. No induration Neurologic: Facial asymmetry, speech fluent, moving extremities spontaneously. Psychiatric: Normal judgment and insight. Alert and oriented x 3. Normal mood.   Labs on Admission: I have personally reviewed following labs and imaging studies  CBC: Recent Labs  Lab 01/14/23 1509 01/14/23 1513  WBC 6.6  --   NEUTROABS 2.5  --   HGB 13.6 14.6  HCT 41.7 43.0  MCV 90.8  --   PLT 269  --    Basic Metabolic Panel: Recent Labs  Lab 01/14/23 1509 01/14/23 1513  NA 140 142  K 3.6 3.6  CL 105 103  CO2 24  --   GLUCOSE 142* 139*  BUN 13 12  CREATININE 1.45* 1.70*  CALCIUM 9.0  --    GFR: Estimated Creatinine Clearance: 75.2 mL/min (A) (by C-G formula based on SCr of 1.7 mg/dL (H)). Liver Function Tests: Recent Labs  Lab 01/14/23 1509  AST 48*  ALT 41  ALKPHOS 54  BILITOT 1.2*  PROT 7.4  ALBUMIN 4.3   Cardiac Enzymes: Recent Labs  Lab 01/14/23 1509  CKTOTAL 2,637*   CBG: Recent Labs  Lab 01/14/23 1448  GLUCAP 200*   Urine analysis:    Component Value Date/Time   COLORURINE YELLOW 01/14/2023 1713   APPEARANCEUR CLEAR 01/14/2023 1713   LABSPEC 1.006 01/14/2023 1713   PHURINE 6.0 01/14/2023 1713   GLUCOSEU NEGATIVE 01/14/2023 1713   HGBUR NEGATIVE 01/14/2023 1713   BILIRUBINUR NEGATIVE 01/14/2023 1713   KETONESUR NEGATIVE 01/14/2023 1713   PROTEINUR NEGATIVE 01/14/2023 1713   NITRITE NEGATIVE 01/14/2023 1713    LEUKOCYTESUR NEGATIVE 01/14/2023 1713    Radiological Exams on Admission: No results found.  EKG: Independently reviewed.  Sinus tachycardia rate 115, QTc 419.  No significant change from prior.  Assessment/Plan Principal Problem:   Acute metabolic encephalopathy Active Problems:   Alcohol intoxication (HCC)   Substance abuse (HCC)  Assessment and Plan: * Acute metabolic encephalopathy Initially agitated, diaphoretic, tachycardic to 170s, tachypneic.  Mental status is significantly improved, patient calm, able to answer questions.  Likely due to toxic ingestion "? Synthetic  stimulant drugs mixed with weed".  UDS positive for THC.  Also with alcohol intoxication.  Spouse suggesting suicidal ideation, and patient taking "purple pill". Need to consider spouse may have an ulterior motive for saying this. Patient request that we do not let CPS know about this hospitalization. -  Patient IVC'd in ED  - TTS consult in a.m. -1 L bolus given, continue N/s + 20kcl 100cc/hr x 5hrs   Alcohol intoxication (HCC) Blood alcohol level of 96.  1 and then 2 mg of Ativan given in ED.  Patient denies daily alcohol abuse, spouse reports patient drinks 12 beers at a time. -CIWA as needed -Check magnesium, phosphorus -Thiamine, folate, multivitamins   Nontraumatic rhabdomyolysis-CK 2637. -Trend  DVT prophylaxis: Lovenox Code Status: FULL Family Communication: none at bedside Disposition Plan: ~ 1 day Consults called: TTS Admission status:  Obs tele   Author: Onnie Boer, MD 01/14/2023 8:14 PM  For on call review [email or web address].

## 2023-01-14 NOTE — Assessment & Plan Note (Addendum)
Initially agitated, diaphoretic, tachycardic to 170s, tachypneic.  Mental status is significantly improved, patient calm, able to answer questions.  Likely due to toxic ingestion "? Synthetic stimulant drugs mixed with weed".  UDS positive for THC.  Also with alcohol intoxication.  Spouse suggesting suicidal ideation, and patient taking "purple pill". Need to consider spouse may have an ulterior motive for saying this. Patient request that we do not let CPS know about this hospitalization. -  Patient IVC'd in ED  - TTS consult in a.m. -1 L bolus given, continue N/s + 20kcl 100cc/hr x 5hrs

## 2023-01-14 NOTE — ED Provider Notes (Signed)
[place] EM[facility]ENC[facility] DEPARTMENT AT [facility] Provider Note   CSN: [id number] Arrival date & time: 01/14/23  1441     History  Chief Complaint  Patient presents with   Altered Mental Status    Travis Hurst is a 27 y.o. male.  HPI Patient arrives via EMS from the parking lot outside of the hospital.  The patient cannot provide his own details, level 5 caveat secondary to acuity of condition. EMS reports the patient was a visitor, visiting his wife who is upstairs.  He was found in the parking lot agitated, diaphoretic, tachycardic, acting nonsensically.  He acknowledges smoking marijuana, otherwise unclear what may have occurred.  EMS reports the patient is on their initial evaluation, patient was diaphoretic, acting in a manner consistent with encephalopathy.  No report of trauma, fall    Home Medications Prior to Admission medications   Medication Sig Start Date End Date Taking? Authorizing Provider  cyclobenzaprine (FLEXERIL) 10 MG tablet Take 10 mg by mouth 3 (three) times daily as needed for muscle spasms. Patient not taking: Reported on 03/29/2022    [provider]  erythromycin ophthalmic ointment Place a 1/2 inch ribbon of ointment into the lower eyelid of the left eye 3 times a day. Patient not taking: Reported on 03/29/2022 05/24/21   Vanetta Mulders, MD      Allergies    Patient has no known allergies.    Review of Systems   Review of Systems  Physical Exam Updated Vital Signs Ht 6\' 2"  (1.88 m)   Wt 83.9 kg   BMI 23.75 kg/m  Physical Exam Vitals and nursing note reviewed.  Constitutional:      Appearance: He is well-developed. He is diaphoretic.  HENT:     Head: Normocephalic and atraumatic.  Eyes:     Conjunctiva/sclera: Conjunctivae normal.  Cardiovascular:     Rate and Rhythm: Regular rhythm. Tachycardia present.  Pulmonary:     Effort: Pulmonary effort is normal. No respiratory distress.     Breath sounds: No stridor.   Abdominal:     General: There is no distension.     Tenderness: There is no guarding.  Skin:    General: Skin is warm.  Neurological:     Mental Status: He is alert.     Comments: Patient grossly oriented, knows his wife is in the hospital, knows his name, follows commands loosely, inconsistently, moves all extremity spontaneously, has no facial asymmetry.  Psychiatric:        Behavior: Behavior is agitated and hyperactive. Behavior is not aggressive or combative.     ED Results / Procedures / Treatments   Labs (all labs ordered are listed, but only abnormal results are displayed) Labs Reviewed  CBG MONITORING, ED - Abnormal; Notable for the following components:      Result Value   Glucose-Capillary 200 (*)    All other components within normal limits  COMPREHENSIVE METABOLIC PANEL  ETHANOL  CBC WITH DIFFERENTIAL/PLATELET  URINALYSIS, W/ REFLEX TO CULTURE (INFECTION SUSPECTED)  RAPID URINE DRUG SCREEN, HOSP PERFORMED    EKG None  Radiology No results found.  Procedures Procedures    Medications Ordered in ED Medications  sodium chloride 0.9 % bolus 1,000 mL (has no administration in time range)  LORazepam (ATIVAN) injection 1 mg (has no administration in time range)    ED Course/ Medical Decision Making/ A&P  Medical Decision Making Patient presents via EMS with concern for right-sided delirium.  Patient acknowledged with marijuana, some consideration of laced product given the absence of other overt abnormalities including trauma, fall.  Patient received fluids, Ativan, monitoring, and with nursing assistance, patient had monitoring.  Amount and/or Complexity of Data Reviewed Independent Historian: EMS Labs: ordered.  Risk Prescription drug management. Decision regarding hospitalization. Diagnosis or treatment significantly limited by social determinants of health.   Thin adult male presents with excited delirium in the  context of acknowledged marijuana use, with concern for coingestants versus other etiology for encephalopathy.  Patient received Ativan, fluids as above, on signout is awaiting labs, additional evaluation.        Final Clinical Impression(s) / ED Diagnoses Final diagnoses:  Acute encephalopathy     Gerhard Munch, MD 01/14/23 1459

## 2023-01-14 NOTE — Plan of Care (Signed)
  Problem: Activity: Goal: Risk for activity intolerance will decrease Outcome: Progressing   Problem: Coping: Goal: Level of anxiety will decrease Outcome: Progressing   Problem: Pain Management: Goal: General experience of comfort will improve Outcome: Progressing

## 2023-01-14 NOTE — ED Triage Notes (Signed)
Pt brought in by [facility] from the parking lot of the hospital. EMS reports hospital security called [employer] due to patient's abnormal behavior. EMS was then called and they transported pt to the ED. Pt reports smoking marijuana today. EMS reports HR 190, O2 sat 92% RA. They were unable to get a BP due to pt's behavior.

## 2023-01-14 NOTE — ED Notes (Signed)
ED TO INPATIENT HANDOFF REPORT  ED Nurse Name and Phone #:  Beverley Fiedler Name/Age/Gender Travis Hurst 27 y.o. male Room/Bed: APA18/APA18  Code Status   Code Status: Not on file  Home/SNF/Other Home Patient oriented to: self, place, and situation Is this baseline? No   Triage Complete: Triage complete  Chief Complaint Acute metabolic encephalopathy [G93.41]  Triage Note Pt brought in by [facility] from the parking lot of the hospital. EMS reports hospital security called [employer] due to patient's abnormal behavior. EMS was then called and they transported pt to the ED. Pt reports smoking marijuana today. EMS reports HR 190, O2 sat 92% RA. They were unable to get a BP due to pt's behavior.    Allergies No Known Allergies  Level of Care/Admitting Diagnosis ED Disposition     ED Disposition  Admit   Condition  --   Comment  Hospital Area: [facility] [[id number]]  Level of Care: Telemetry [5]  Covid Evaluation: Asymptomatic - no recent exposure (last 10 days) testing not required  Diagnosis: Acute metabolic encephalopathy [3295188]  Admitting Physician: Onnie Boer [[id number]]  Attending Physician: Onnie Boer Xenia.Colburn Asper          B Medical/Surgery History No past medical history on file. No past surgical history on file.   A IV Location/Drains/Wounds Patient Lines/Drains/Airways Status     Active Line/Drains/Airways     Name Placement date Placement time Site Days   Peripheral IV 01/14/23 20 G 1" Anterior;Proximal;Right Forearm 01/14/23  1512  Forearm  less than 1            Intake/Output Last 24 hours  Intake/Output Summary (Last 24 hours) at 01/14/2023 1854 Last data filed at 01/14/2023 1715 Gross per 24 hour  Intake 1998 ml  Output --  Net 1998 ml    Labs/Imaging Results for orders placed or performed during the hospital encounter of 01/14/23 (from the past 48 hours)  CBG monitoring, ED     Status: Abnormal   Collection Time:  01/14/23  2:48 PM  Result Value Ref Range   Glucose-Capillary 200 (H) 70 - 99 mg/dL    Comment: Glucose reference range applies only to samples taken after fasting for at least 8 hours.  Comprehensive metabolic panel     Status: Abnormal   Collection Time: 01/14/23  3:09 PM  Result Value Ref Range   Sodium 140 135 - 145 mmol/L   Potassium 3.6 3.5 - 5.1 mmol/L   Chloride 105 98 - 111 mmol/L   CO2 24 22 - 32 mmol/L   Glucose, Bld 142 (H) 70 - 99 mg/dL    Comment: Glucose reference range applies only to samples taken after fasting for at least 8 hours.   BUN 13 6 - 20 mg/dL   Creatinine, Ser 0.63 (H) 0.61 - 1.24 mg/dL   Calcium 9.0 8.9 - 01.6 mg/dL   Total Protein 7.4 6.5 - 8.1 g/dL   Albumin 4.3 3.5 - 5.0 g/dL   AST 48 (H) 15 - 41 U/L   ALT 41 0 - 44 U/L   Alkaline Phosphatase 54 38 - 126 U/L   Total Bilirubin 1.2 (H) <1.2 mg/dL   GFR, Estimated >01 >09 mL/min    Comment: (NOTE) Calculated using the CKD-EPI Creatinine Equation (2021)    Anion gap 11 5 - 15    Comment: Performed at [facility], [address], [place], [place] [address]  Ethanol     Status: Abnormal  Collection Time: 01/14/23  3:09 PM  Result Value Ref Range   Alcohol, Ethyl (B) 96 (H) <10 mg/dL    Comment: (NOTE) Lowest detectable limit for serum alcohol is 10 mg/dL.  For medical purposes only. Performed at [facility], [address] [place], [place] [address]   CBC with Differential     Status: None   Collection Time: 01/14/23  3:09 PM  Result Value Ref Range   WBC 6.6 4.0 - 10.5 K/uL   RBC 4.59 4.22 - 5.81 MIL/uL   Hemoglobin 13.6 13.0 - 17.0 g/dL   HCT 53.6 64.4 - 03.4 %   MCV 90.8 80.0 - 100.0 fL   MCH 29.6 26.0 - 34.0 pg   MCHC 32.6 30.0 - 36.0 g/dL   RDW 74.2 59.5 - 63.8 %   Platelets 269 150 - 400 K/uL   nRBC 0.0 0.0 - 0.2 %   Neutrophils Relative % 38 %   Neutro Abs 2.5 1.7 - 7.7 K/uL   Lymphocytes Relative 45 %   Lymphs Abs 3.1 0.7 - 4.0 K/uL   Monocytes Relative 15 %   Monocytes  Absolute 1.0 0.1 - 1.0 K/uL   Eosinophils Relative 1 %   Eosinophils Absolute 0.1 0.0 - 0.5 K/uL   Basophils Relative 1 %   Basophils Absolute 0.0 0.0 - 0.1 K/uL   Immature Granulocytes 0 %   Abs Immature Granulocytes 0.02 0.00 - 0.07 K/uL    Comment: Performed at [facility], [address] [place], [place] [address]  Acetaminophen level     Status: Abnormal   Collection Time: 01/14/23  3:09 PM  Result Value Ref Range   Acetaminophen (Tylenol), Serum <10 (L) 10 - 30 ug/mL    Comment: (NOTE) Therapeutic concentrations vary significantly. A range of 10-30 ug/mL  may be an effective concentration for many patients. However, some  are best treated at concentrations outside of this range. Acetaminophen concentrations >150 ug/mL at 4 hours after ingestion  and >50 ug/mL at 12 hours after ingestion are often associated with  toxic reactions.  Performed at [facility], [address] [place], [place] [address]   Salicylate level     Status: Abnormal   Collection Time: 01/14/23  3:09 PM  Result Value Ref Range   Salicylate Lvl <7.0 (L) 7.0 - 30.0 mg/dL    Comment: Performed at [facility], [address] [place], [place] [address]  CK     Status: Abnormal   Collection Time: 01/14/23  3:09 PM  Result Value Ref Range   Total CK 2,637 (H) 49 - 397 U/L    Comment: RESULT CONFIRMED BY AUTOMATED DILUTION Performed at [facility], [address] [place], [place] [address]   I-stat chem 8, ed     Status: Abnormal   Collection Time: 01/14/23  3:13 PM  Result Value Ref Range   Sodium 142 135 - 145 mmol/L   Potassium 3.6 3.5 - 5.1 mmol/L   Chloride 103 98 - 111 mmol/L   BUN 12 6 - 20 mg/dL   Creatinine, Ser 1.60 (H) 0.61 - 1.24 mg/dL   Glucose, Bld 109 (H) 70 - 99 mg/dL    Comment: Glucose reference range applies only to samples taken after fasting for at least 8 hours.   Calcium, Ion 1.16 1.15 - 1.40 mmol/L   TCO2 25 22 - 32 mmol/L   Hemoglobin 14.6 13.0 - 17.0 g/dL   HCT 32.3 55.7 -  32.2 %  Urinalysis, w/ Reflex  to Culture (Infection Suspected) -Urine, Clean Catch     Status: None   Collection Time: 01/14/23  5:13 PM  Result Value Ref Range   Specimen Source URINE, CLEAN CATCH    Color, Urine YELLOW YELLOW   APPearance CLEAR CLEAR   Specific Gravity, Urine 1.006 1.005 - 1.030   pH 6.0 5.0 - 8.0   Glucose, UA NEGATIVE NEGATIVE mg/dL   Hgb urine dipstick NEGATIVE NEGATIVE   Bilirubin Urine NEGATIVE NEGATIVE   Ketones, ur NEGATIVE NEGATIVE mg/dL   Protein, ur NEGATIVE NEGATIVE mg/dL   Nitrite NEGATIVE NEGATIVE   Leukocytes,Ua NEGATIVE NEGATIVE   RBC / HPF 0-5 0 - 5 RBC/hpf   WBC, UA 0-5 0 - 5 WBC/hpf    Comment:        Reflex urine culture not performed if WBC <=10, OR if Squamous epithelial cells >5. If Squamous epithelial cells >5 suggest recollection.    Bacteria, UA NONE SEEN NONE SEEN   Squamous Epithelial / HPF 0-5 0 - 5 /HPF    Comment: Performed at [facility], [address] [place], [place] [address]  Urine rapid drug screen (hosp performed)     Status: Abnormal   Collection Time: 01/14/23  5:13 PM  Result Value Ref Range   Opiates NONE DETECTED NONE DETECTED   Cocaine NONE DETECTED NONE DETECTED   Benzodiazepines NONE DETECTED NONE DETECTED   Amphetamines NONE DETECTED NONE DETECTED   Tetrahydrocannabinol POSITIVE (A) NONE DETECTED   Barbiturates NONE DETECTED NONE DETECTED    Comment: (NOTE) DRUG SCREEN FOR MEDICAL PURPOSES ONLY.  IF CONFIRMATION IS NEEDED FOR ANY PURPOSE, NOTIFY LAB WITHIN 5 DAYS.  LOWEST DETECTABLE LIMITS FOR URINE DRUG SCREEN Drug Class                     Cutoff (ng/mL) Amphetamine and metabolites    1000 Barbiturate and metabolites    200 Benzodiazepine                 200 Opiates and metabolites        300 Cocaine and metabolites        300 THC                            50 Performed at [facility], [address] [place], [place] [address]   Blood gas, venous     Status: None   Collection Time: 01/14/23   5:56 PM  Result Value Ref Range   pH, Ven 7.34 7.25 - 7.43   pCO2, Ven 52 44 - 60 mmHg   pO2, Ven 41 32 - 45 mmHg   Bicarbonate 27.5 20.0 - 28.0 mmol/L   Acid-Base Excess 0.9 0.0 - 2.0 mmol/L   O2 Saturation 72.1 %   Patient temperature 37.3    Collection site BLOOD RIGHT FOREARM    Drawn by [phone]     Comment: Performed at [facility], [address] [place], [place] [address]   No results found.  Pending Labs Unresulted Labs (From admission, onward)    None       Vitals/Pain Today's Vitals   01/14/23 1550 01/14/23 1600 01/14/23 1605 01/14/23 1737  BP: (!) 136/94 (!) 139/106  (!) 160/82  Pulse: (!) 153 (!) 147  (!) 109  Resp:      Temp:   98.4 F (36.9 C)   TempSrc:   Axillary   SpO2:  93%  Weight:      Height:      PainSc:        Isolation Precautions No active isolations  Medications Medications  sodium chloride 0.9 % bolus 1,000 mL (0 mLs Intravenous Stopped 01/14/23 1610)  LORazepam (ATIVAN) injection 1 mg (1 mg Intravenous Given 01/14/23 1519)  LORazepam (ATIVAN) injection 2 mg (2 mg Intravenous Given 01/14/23 1551)  sodium chloride 0.9 % bolus 1,000 mL (0 mLs Intravenous Stopped 01/14/23 1715)    Mobility walks      R Recommendations: See Admitting Provider Note  Report given to:

## 2023-01-14 NOTE — ED Provider Notes (Signed)
6:41 PM Assumed care of patient from off-going team. For more details, please see note from same day.  In brief, this is a 27 y.o. male who was a patient visitor and found down in the hospital parking lot with excited delirium. Endorsed THC use.  HR decreased from 190s to 140s. NCAT, no trauma.  Plan/Dispo at time of sign-out & ED Course since sign-out: Ativan, labs, fluids  BP (!) 160/82   Pulse (!) 109   Temp 98.4 F (36.9 C) (Axillary)   Resp (!) 22   Ht 6\' 2"  (1.88 m)   Wt 83.9 kg   SpO2 93%   BMI 23.75 kg/m    ED Course:   Clinical Course as of 01/14/23 1841  Mon Jan 14, 2023  1547 Evaluated patient at time of signout.  Patient is altered, agitated, pulling at lines and profusely diaphoretic.  He is tachycardic into the 150s beats per minute.  Blood pressure 121/83.  Patient denies any pain anywhere.  He endorses only THC use, states he smoked a joint.  Patient's presentation distinctly inconsistent with THC intoxication.  Wife is now at bedside after being discharged from the hospital.  She pulls me aside into the hallway and states the patient told her he "tried to off himself."  She states that he has never done this before and she is unaware of why he would try to do this.  She states that he told her that he snorted purple pills. Consider stimulant intoxication. He also drinks 12 packs of beers at a time per the wife, she is unaware that he has ever had alcohol withdrawal. He is not vomiting or nauseous, not tremulous or hypertensive, but consider alcohol withdrawal too as possible etiology. He has no tongue fasiculations. His pupils are also ~16mm more c/w stimulant intoxication. Either way will treat with additional 1mg  IV ativan and reassess. Patient will also be IVC'd for AMS and reported suicide attempt.  [HN]  1657 CK Total(!): 2,637 +rhabdomyolysis, getting 2nd L of fluid [HN]  1658 Creatinine(!): 1.45 No prior value for comparison, must assume AKI [HN]  1658 Alcohol,  Ethyl (B)(!): 96 [HN]  1659 Pt HR now in 120s, he is calmer now, intermittently seems to sleep, but still intermittently mildly agitated, altered. [HN]  1720 Neg acetaminophen/salicylate [HN]  1804 Tetrahydrocannabinol(!): POSITIVE No cocaine or methamphetamine noted on UDS. Could consider other stimulants such as bath salts, PCP. Presentation not c/w MDMA, anticholinergic toxidrome given pinpoint pupils. Police informed RN that there was concern for heroin use as well, consider opiate withdrawal. Patient now with HR in 110s, is calmer, still diaphoretic and intermittently agitated. Will consult for admission. [HN]  1840 Patient admitted to hospitalist. Under IVC. [HN]    Clinical Course User Index [HN] Loetta Rough, MD    CRITICAL CARE Performed by: Loetta Rough Total critical care time: 30 minutes Critical care time was exclusive of separately billable procedures and treating other patients. Critical care was necessary to treat or prevent imminent or life-threatening deterioration. Critical care was time spent personally by me on the following activities: development of treatment plan with patient and/or surrogate as well as nursing, discussions with consultants, evaluation of patient's response to treatment, examination of patient, obtaining history from patient or surrogate, ordering and performing treatments and interventions, ordering and review of laboratory studies, pulse oximetry and re-evaluation of patient's condition.   ------------------------------- Vivi Barrack, MD Emergency Medicine  This note was created using dictation software, which may contain  spelling or grammatical errors.   Loetta Rough, MD 01/14/23 [phone]

## 2023-01-14 NOTE — Assessment & Plan Note (Signed)
Blood alcohol level of 96.  1 and then 2 mg of Ativan given in ED.  Patient denies daily alcohol abuse, spouse reports patient drinks 12 beers at a time. -CIWA as needed -Check magnesium, phosphorus -Thiamine, folate, multivitamins

## 2023-01-15 ENCOUNTER — Telehealth: Payer: Self-pay

## 2023-01-15 DIAGNOSIS — M6282 Rhabdomyolysis: Secondary | ICD-10-CM | POA: Diagnosis present

## 2023-01-15 DIAGNOSIS — N179 Acute kidney failure, unspecified: Secondary | ICD-10-CM

## 2023-01-15 DIAGNOSIS — F19959 Other psychoactive substance use, unspecified with psychoactive substance-induced psychotic disorder, unspecified: Secondary | ICD-10-CM | POA: Insufficient documentation

## 2023-01-15 DIAGNOSIS — G9340 Encephalopathy, unspecified: Secondary | ICD-10-CM

## 2023-01-15 DIAGNOSIS — F19921 Other psychoactive substance use, unspecified with intoxication with delirium: Secondary | ICD-10-CM

## 2023-01-15 LAB — HIV ANTIBODY (ROUTINE TESTING W REFLEX): HIV Screen 4th Generation wRfx: NONREACTIVE

## 2023-01-15 LAB — CK: Total CK: 3047 U/L — ABNORMAL HIGH (ref 49–397)

## 2023-01-15 MED ORDER — ENOXAPARIN SODIUM 40 MG/0.4ML IJ SOSY
40.0000 mg | PREFILLED_SYRINGE | INTRAMUSCULAR | Status: DC
  Filled 2023-01-15: qty 0.4

## 2023-01-15 MED ORDER — THIAMINE HCL 100 MG/ML IJ SOLN: 100.0000 mg | Freq: Every day | INTRAMUSCULAR | Status: DC

## 2023-01-15 MED ORDER — POTASSIUM CHLORIDE IN NACL 20-0.9 MEQ/L-% IV SOLN: INTRAVENOUS | Status: DC

## 2023-01-15 MED ORDER — ACETAMINOPHEN 650 MG RE SUPP: 650.0000 mg | Freq: Four times a day (QID) | RECTAL | Status: DC | PRN

## 2023-01-15 MED ORDER — FOLIC ACID 1 MG PO TABS
1.0000 mg | ORAL_TABLET | Freq: Every day | ORAL | Status: DC
  Administered 2023-01-15 – 2023-01-16 (×2): 1 mg via ORAL
  Filled 2023-01-15 (×2): qty 1

## 2023-01-15 MED ORDER — POLYETHYLENE GLYCOL 3350 17 G PO PACK: 17.0000 g | PACK | Freq: Every day | ORAL | Status: DC | PRN

## 2023-01-15 MED ORDER — ACETAMINOPHEN 325 MG PO TABS: 650.0000 mg | ORAL_TABLET | Freq: Four times a day (QID) | ORAL | Status: DC | PRN

## 2023-01-15 MED ORDER — ADULT MULTIVITAMIN W/MINERALS CH
1.0000 | ORAL_TABLET | Freq: Every day | ORAL | Status: DC
  Administered 2023-01-15 – 2023-01-16 (×2): 1 via ORAL
  Filled 2023-01-15 (×2): qty 1

## 2023-01-15 MED ORDER — POTASSIUM CHLORIDE IN NACL 20-0.9 MEQ/L-% IV SOLN: INTRAVENOUS | Status: AC

## 2023-01-15 MED ORDER — LORAZEPAM 1 MG PO TABS: 1.0000 mg | ORAL_TABLET | ORAL | Status: DC | PRN

## 2023-01-15 MED ORDER — ONDANSETRON HCL 4 MG/2ML IJ SOLN: 4.0000 mg | Freq: Four times a day (QID) | INTRAMUSCULAR | Status: DC | PRN

## 2023-01-15 MED ORDER — ONDANSETRON HCL 4 MG PO TABS: 4.0000 mg | ORAL_TABLET | Freq: Four times a day (QID) | ORAL | Status: DC | PRN

## 2023-01-15 MED ORDER — LORAZEPAM 2 MG/ML IJ SOLN: 1.0000 mg | INTRAMUSCULAR | Status: DC | PRN

## 2023-01-15 MED ORDER — THIAMINE MONONITRATE 100 MG PO TABS
100.0000 mg | ORAL_TABLET | Freq: Every day | ORAL | Status: DC
  Administered 2023-01-15 – 2023-01-16 (×2): 100 mg via ORAL
  Filled 2023-01-15 (×2): qty 1
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
## 2023-01-15 NOTE — TOC CM/SW Note (Signed)
Transition of Care [facility]) - Inpatient Brief Assessment   Patient Details  Name: Travis Hurst MRN: [id number] Date of Birth: Dec 21, 1995  Transition of Care [facility]) CM/SW Contact:    Isabella Bowens, LCSWA Phone Number: 01/15/2023, 9:18 AM   Clinical Narrative: Pt was admitted for Acute Metabolic. Patient was consulted for substance abuse resources/education. Inpatient and Outpatient resources added to AVS . CSW spoke with pt about resources and pt asked if he had to go. CSW assured that resources was added but it is not mandatory, pt understands. CSW also shared with pt that care connect can assist with finding a PCP and pt stated, " I just got insurance and a new PCP". Pt had no questions and will DC back to apartment with spouse once medically ready. TOC signing off.   Transition of Care Asessment: Insurance and Status: Selfpay (Patient has no insurance, connected him with [facility]) Patient has primary care physician: No (Added accepting PCP list to DC instructions) Home environment has been reviewed: Apartment Prior level of function:: Independent Prior/Current Home Services: No current home services Social Drivers of Health Review: SDOH reviewed no interventions necessary Readmission risk has been reviewed: Yes Transition of care needs: transition of care needs identified, TOC will continue to follow

## 2023-01-15 NOTE — Discharge Instructions (Signed)
  Providers Accepting New Patients in [place], [place]    [facility] [address]  [place], [place] [address] [phone] Accepts most insurances  [facility] [address] [place], [place] [address] [phone] Accepts most insurances  [facility] [address] [place], [place] [address]  [phone] Must meet requirements  [facility] [address] [place], [place] [address] [phone] Accepts most insurances  [facility] [address]  [place], [place] [address] [phone] Accepts most insurances  [facility] [address]   [place], [place]   [phone] Accepts most insurances  [facility])  [phone] [address]  [place], [place] [address] [phone] Accepts most insurances     [facility] [address]  [place], [place] [address] [phone] Accepts most insurances  [facility] [address] [place], [place] [address] [phone] option 1 Accepts Medicaid and [facility] [address]  [place], [place] [address] [phone] Accepts most insurances  Avon Gully, MD [address] [place], [place] [address] [phone] Accepts most insurances  [facility] at [facility] [address]  [place], [place] [address] [phone] Accepts most insurances  [facility] [phone] [place], [place] [address] [phone] Accepts most insurances  [place], [address] [place], [place] [address] [phone]  Accepts most insurances

## 2023-01-15 NOTE — Hospital Course (Signed)
26 y.o. male with medical history significant for substance abuse.  Patient was brought to the ED by EMS from our hospital parking lot reports of altered mental status. At the time of my evaluation, patient is awake alert oriented calm and able to answer questions.  He remembers the events of the day.  His wife was admitted to the hospital and was discharged today.  He was coming back to the hospital from running errands getting food for his spouse,.  Also gotten some "weed" in a vape pen from someone who we had not gotten from before.  He now does not think it was weed inside, but he does not know what was in what he smoked.  Hospital security called [employer] due to patient's abnormal behavior in the parking lot, patient was agitated, diaphoretic, acting nonsensically, EMS report heart rate in the 190s.   Later patient's wife came down to the ED after she was discharged from the hospital, she pulled the EDP aside in the hallway and told the EDP that patient told her "he tried to cough himself" that he has never done this before and she does not know why he would try to do this, and patient told her he is purple pills.  spouse also reported patient drinks 12 packs of beer at the time.  In the ED, UDS was pos for marijuana. CK elevated at 2637. Pt was admitted for further work up

## 2023-01-15 NOTE — BH Assessment (Signed)
Clinician messaged Dr. Scheryl Marten. Rhona Leavens and Carleene Cooper, LPN asking if the pt was medically cleared. If so the pt's assessment can be completed.   Clinician awaiting response.  Redmond Pulling, MS, [facility] Triage Specialist [phone]

## 2023-01-15 NOTE — Progress Notes (Signed)
Progress Note   Patient: Travis Hurst PXT:[id number] DOB: 1995/04/21 DOA: 01/14/2023     0 DOS: the patient was seen and examined on 01/15/2023   Brief hospital course: 27 y.o. male with medical history significant for substance abuse.  Patient was brought to the ED by EMS from our hospital parking lot reports of altered mental status. At the time of my evaluation, patient is awake alert oriented calm and able to answer questions.  He remembers the events of the day.  His wife was admitted to the hospital and was discharged today.  He was coming back to the hospital from running errands getting food for his spouse,.  Also gotten some "weed" in a vape pen from someone who we had not gotten from before.  He now does not think it was weed inside, but he does not know what was in what he smoked.  Hospital security called [employer] due to patient's abnormal behavior in the parking lot, patient was agitated, diaphoretic, acting nonsensically, EMS report heart rate in the 190s.   Later patient's wife came down to the ED after she was discharged from the hospital, she pulled the EDP aside in the hallway and told the EDP that patient told her "he tried to cough himself" that he has never done this before and she does not know why he would try to do this, and patient told her he is purple pills.  spouse also reported patient drinks 12 packs of beer at the time.  In the ED, UDS was pos for marijuana. CK elevated at 2637. Pt was admitted for further work up  Assessment and Plan: Acute metabolic encephalopathy Reportedly presented agitated, diaphoretic, tachycardic to 170s, tachypneic.  Mental status is significantly improved, patient calm, able to answer questions.  Likely due to toxic ingestion  -Seems improved this AM -Psychiatry was consulted at time of presentation, appreciate recs. Psychiatry in the process of getting collateral history from family given concerns of possible suicide ideations. Per Psychiatry,  pt remains IVC until safety can be confirmed by Psych    Alcohol intoxication (HCC) Blood alcohol level of 96.  1 and then 2 mg of Ativan given in ED.  Patient denies daily alcohol abuse, spouse reports patient drinks 12 beers at a time. -Cont CIWA as needed    Nontraumatic rhabdomyolysis-CK 2637 at presentation -Repeat CK is higher at 3047 -Cr is up to 1.7 fro 1.45 -Have increased IVF rate to 125cc/hr -Cont to trend CK levels -recheck bmet in AM   Subjective: Not very conversant at all this AM but did report he was feeling better  Physical Exam: Vitals:   01/15/23 0010 01/15/23 0402 01/15/23 0749 01/15/23 1400  BP: (!) 151/69 131/75 127/77 (!) 153/90  Pulse: 70 71 80 93  Resp: 20 19 14 14   Temp: 98.1 F (36.7 C) 98 F (36.7 C)    TempSrc:      SpO2: 100% 100% 100% 100%  Weight:      Height:       General exam: Awake, laying in bed, in nad Respiratory system: Normal respiratory effort, no wheezing Cardiovascular system: regular rate, s1, s2 Gastrointestinal system: Soft, nondistended, positive BS Central nervous system: CN2-12 grossly intact, strength intact Extremities: Perfused, no clubbing Skin: Normal skin turgor, no notable skin lesions seen Psychiatry: Mood normal // no visual hallucinations   Data Reviewed:  Labs reviewed: Na 142, K 3.6, Cr 1.70, CK 3047  Family Communication: Pt in room, family not at bedside  Disposition: Status is: Observation The patient will require care spanning > 2 midnights and should be moved to inpatient because: severity of illness  Planned Discharge Destination: Home    Author: Rickey Barbara, MD 01/15/2023 3:28 PM  For on call review [email or web address].

## 2023-01-15 NOTE — Progress Notes (Signed)
This RN discussed behavioral health guidelines with patient. His belongings were removed from room and placed at nurses station. He was wanded by security.  He is pleasant and cooperative. He is agreeable and verbalizes understanding of IVC papers, the need for TTS, and the importance of following rules on the unit. Sitter remains at bedside. Kellogg RN

## 2023-01-15 NOTE — Telephone Encounter (Signed)
Received referral from [place], A social worker at [facility] to follow up with patient for having no PCP or insurance. Once patient is discharged, I plan to follow up with the patient and attempt to enroll the patient into the [facility] program.

## 2023-01-15 NOTE — Consult Note (Signed)
Patient assessed by psychiatry, he is psych cleared.  Complete consult note to follow.  Above was discussed with Dr. Rickey Barbara, attending and Danna Hefty, LPN.

## 2023-01-16 DIAGNOSIS — F19959 Other psychoactive substance use, unspecified with psychoactive substance-induced psychotic disorder, unspecified: Secondary | ICD-10-CM

## 2023-01-16 LAB — BASIC METABOLIC PANEL
Anion gap: 10 (ref 5–15)
BUN: 9 mg/dL (ref 6–20)
CO2: 23 mmol/L (ref 22–32)
Calcium: 9.7 mg/dL (ref 8.9–10.3)
Chloride: 104 mmol/L (ref 98–111)
Creatinine, Ser: 0.89 mg/dL (ref 0.61–1.24)
GFR, Estimated: >60 mL/min (ref >60)
Glucose, Bld: 109 mg/dL — ABNORMAL HIGH (ref 70–99)
Potassium: 4.1 mmol/L (ref 3.5–5.1)
Sodium: 137 mmol/L (ref 135–145)

## 2023-01-16 LAB — CK TOTAL AND CKMB (NOT AT ARMC)
CK, MB: 4.8 ng/mL (ref 0.5–5.0)
Total CK: 2739 U/L — ABNORMAL HIGH (ref 49–397)

## 2023-01-16 MED ORDER — NALTREXONE HCL 50 MG PO TABS: 50.0000 mg | ORAL_TABLET | Freq: Every day | ORAL | 1 refills | Status: AC

## 2023-01-16 MED ORDER — VITAMIN B-1 100 MG PO TABS: 100.0000 mg | ORAL_TABLET | Freq: Every day | ORAL | 0 refills | Status: AC

## 2023-01-16 MED ORDER — FOLIC ACID 1 MG PO TABS: 1.0000 mg | ORAL_TABLET | Freq: Every day | ORAL | 0 refills | Status: AC

## 2023-01-16 MED ORDER — ADULT MULTIVITAMIN W/MINERALS CH: 1.0000 | ORAL_TABLET | Freq: Every day | ORAL | 0 refills | Status: AC

## 2023-01-16 MED ORDER — NALTREXONE HCL 50 MG PO TABS
50.0000 mg | ORAL_TABLET | Freq: Every day | ORAL | Status: DC
Start: 2023-01-16 — End: 2023-01-16
  Administered 2023-01-16: 50 mg via ORAL
  Filled 2023-01-16 (×3): qty 1

## 2023-01-16 MED ORDER — LACTATED RINGERS IV BOLUS
1000.0000 mL | Freq: Once | INTRAVENOUS | Status: AC
  Administered 2023-01-16: 1000 mL via INTRAVENOUS

## 2023-01-16 NOTE — Discharge Summary (Signed)
Physician Discharge Summary   Patient: Travis Hurst MRN: [id number] DOB: 13-Jul-1995  Admit date:     01/14/2023  Discharge date: 01/16/23  Discharge Physician: Kendell Bane   PCP: Patient, No Pcp Per   Recommendations at discharge:   Follow-up with PCP in 2-4 weeks Avoid alcohol/alcohol products avoid tobacco/tobacco products Avoid all street drugs Continue aggressive fluid intake in next 24-48 hours preferred Pedialyte versus Gatorade 0  Discharge Diagnoses: Principal Problem:   Psychoactive substance-induced psychosis (HCC) Active Problems:   Acute metabolic encephalopathy   Alcohol intoxication (HCC)   Substance abuse (HCC)   Rhabdomyolysis  Resolved Problems:   * No resolved hospital problems. *  Hospital Course: 27 y.o. male with medical history significant for substance abuse.  Patient was brought to the ED by EMS from our hospital parking lot reports of altered mental status. At the time of my evaluation, patient is awake alert oriented calm and able to answer questions.  He remembers the events of the day.  His wife was admitted to the hospital and was discharged today.  He was coming back to the hospital from running errands getting food for his spouse,.  Also gotten some "weed" in a vape pen from someone who we had not gotten from before.  He now does not think it was weed inside, but he does not know what was in what he smoked.  Hospital security called [employer] due to patient's abnormal behavior in the parking lot, patient was agitated, diaphoretic, acting nonsensically, EMS report heart rate in the 190s.   Later patient's wife came down to the ED after she was discharged from the hospital, she pulled the EDP aside in the hallway and told the EDP that patient told her "he tried to cough himself" that he has never done this before and she does not know why he would try to do this, and patient told her he is purple pills.  spouse also reported patient drinks 12 packs of  beer at the time.  In the ED, UDS was pos for marijuana. CK elevated at 2637. Pt was admitted for further work up   Acute toxic -metabolic encephalopathy -Resolved  Reportedly presented agitated, diaphoretic, tachycardic to 170s, tachypneic.   Mental status is significantly improved, patient calm, able to answer questions.  Likely due to toxic ingestion  -Psychiatry was consulted at time of presentation, appreciate recs. Psychiatry in the process of getting collateral history from family given concerns of possible suicide ideations. Per Psychiatry Cleared by psychiatry -commending naltrexone for alcohol craving Patient denies any suicidal homicidal ideation or plan   Alcohol intoxication (HCC) -Resolved toxic encephalopathy due to alcohol Blood alcohol level of 96.  1 and then 2 mg of Ativan given in ED.   Patient denies daily alcohol abuse, spouse reports patient drinks 12 beers at a time. -No significant withdrawal symptoms   Nontraumatic rhabdomyolysis-CK 2637 at presentation -Repeat CK is higher at 3047 >>> 2739 -Cr is up to 1.7 fro 1.45 >>> 0.89 -/P another liter of normal saline bolus   Patient is adamant to be discharged, he will be discharged home with instruction to abstain from alcohol, alcohol products, street drugs, to continue oral hydration aggressively next 24-48 hours  Consultants: Psych  Disposition: Home Diet recommendation:  Discharge Diet Orders (From admission, onward)     Start     Ordered   01/16/23 0000  Diet - low sodium heart healthy        01/16/23 1210  Regular diet DISCHARGE MEDICATION: Allergies as of 01/16/2023   No Known Allergies      Medication List     TAKE these medications    acetaminophen 500 MG tablet Commonly known as: TYLENOL Take 500 mg by mouth every 6 (six) hours as needed for mild pain (pain score 1-3).   folic acid 1 MG tablet Commonly known as: FOLVITE Take 1 tablet (1 mg total) by mouth daily.    multivitamin with minerals Tabs tablet Take 1 tablet by mouth daily.   naltrexone 50 MG tablet Commonly known as: DEPADE Take 1 tablet (50 mg total) by mouth daily.   thiamine 100 MG tablet Commonly known as: Vitamin B-1 Take 1 tablet (100 mg total) by mouth daily.        Discharge Exam: Filed Weights   01/14/23 1451  Weight: 83.9 kg        General:  AAO x 3,  cooperative, no distress;  Denies any suicidal homicidal ideation or plan  HEENT:  Normocephalic, PERRL, otherwise with in Normal limits   Neuro:  CNII-XII intact. , normal motor and sensation, reflexes intact   Lungs:   Clear to auscultation BL, Respirations unlabored,  No wheezes / crackles  Cardio:    S1/S2, RRR, No murmure, No Rubs or Gallops   Abdomen:  Soft, non-tender, bowel sounds active all four quadrants, no guarding or peritoneal signs.  Muscular  skeletal:  Limited exam -global generalized weaknesses - in bed, able to move all 4 extremities,   2+ pulses,  symmetric, No pitting edema  Skin:  Dry, warm to touch, negative for any Rashes,  Wounds: Please see nursing documentation          Condition at discharge: good  The results of significant diagnostics from this hospitalization (including imaging, microbiology, ancillary and laboratory) are listed below for reference.   Imaging Studies: No results found.  Microbiology: Results for orders placed or performed in visit on 11/04/18  Novel Coronavirus, NAA ([facility])     Status: None   Collection Time: 11/04/18 12:00 AM   Specimen: Oropharyngeal(OP) collection in vial transport medium   OROPHARYNGEA  TESTING  Result Value Ref Range Status   SARS-CoV-2, NAA Not Detected Not Detected Final    Comment: This nucleic acid amplification test was developed and its performance characteristics determined by [employer]. Nucleic acid amplification tests include PCR and TMA. This test has not been FDA cleared or approved. This test has been  authorized by FDA under an Emergency Use Authorization (EUA). This test is only authorized for the duration of time the declaration that circumstances exist justifying the authorization of the emergency use of in vitro diagnostic tests for detection of SARS-CoV-2 virus and/or diagnosis of COVID-19 infection under section 564(b)(1) of the Act, 21 U.S.C. 756EPP-2(R) (1), unless the authorization is terminated or revoked sooner. When diagnostic testing is negative, the possibility of a false negative result should be considered in the context of a patient's recent exposures and the presence of clinical signs and symptoms consistent with COVID-19. An individual without symptoms of COVID-19 and who is not shedding SARS-CoV-2 virus would  expect to have a negative (not detected) result in this assay.     Labs: CBC: Recent Labs  Lab 01/14/23 1509 01/14/23 1513  WBC 6.6  --   NEUTROABS 2.5  --   HGB 13.6 14.6  HCT 41.7 43.0  MCV 90.8  --   PLT 269  --    Basic Metabolic Panel:  Recent Labs  Lab 01/14/23 1509 01/14/23 1513 01/16/23 0823  NA 140 142 137  K 3.6 3.6 4.1  CL 105 103 104  CO2 24  --  23  GLUCOSE 142* 139* 109*  BUN 13 12 9   CREATININE 1.45* 1.70* 0.89  CALCIUM 9.0  --  9.7  MG 2.2  --   --   PHOS 4.5  --   --    Liver Function Tests: Recent Labs  Lab 01/14/23 1509  AST 48*  ALT 41  ALKPHOS 54  BILITOT 1.2*  PROT 7.4  ALBUMIN 4.3   CBG: Recent Labs  Lab 01/14/23 1448  GLUCAP 200*    Discharge time spent: greater than 45 minutes.  Signed: Kendell Bane, MD [facility] 01/16/2023

## 2023-01-18 ENCOUNTER — Telehealth: Payer: Self-pay

## 2023-01-18 NOTE — Telephone Encounter (Signed)
Attempted to reach out to patient about the [facility] Connect program and getting him established with a PCP. No answer, left vm asking for patient to return call.
